# Patient Record
Sex: Male | Born: 1965 | Race: White | Marital: Married | Smoking: Never smoker
Health system: Southern US, Community
[De-identification: ages and names within clinical notes are randomized; demographics above are authoritative.]

---

## 2012-12-21 ENCOUNTER — Ambulatory Visit
Admission: RE | Admit: 2012-12-21 | Discharge: 2012-12-21 | Disposition: A | Discharge status: Home / Self Care | Payer: Managed Care, Other (non HMO) | Source: Ambulatory Visit | Attending: Emergency Medicine | Admitting: Emergency Medicine

## 2012-12-21 ENCOUNTER — Other Ambulatory Visit: Payer: Self-pay | Admitting: Emergency Medicine

## 2012-12-21 DIAGNOSIS — T1490XA Injury, unspecified, initial encounter: Secondary | ICD-10-CM

## 2013-05-24 ENCOUNTER — Other Ambulatory Visit: Payer: Self-pay | Admitting: Emergency Medicine

## 2013-05-24 DIAGNOSIS — M545 Low back pain, unspecified: Secondary | ICD-10-CM

## 2013-05-24 DIAGNOSIS — M79605 Pain in left leg: Principal | ICD-10-CM

## 2013-05-28 ENCOUNTER — Other Ambulatory Visit: Payer: Managed Care, Other (non HMO)

## 2015-01-01 IMAGING — CR DG WRIST COMPLETE 3+V*L*
4 series · 4 of 4 positions shown · non-contrast
Comparison: None.

CLINICAL DATA: Pain

EXAM:
LEFT WRIST - COMPLETE 3+ VIEW

[view not recorded (1 of 4)]
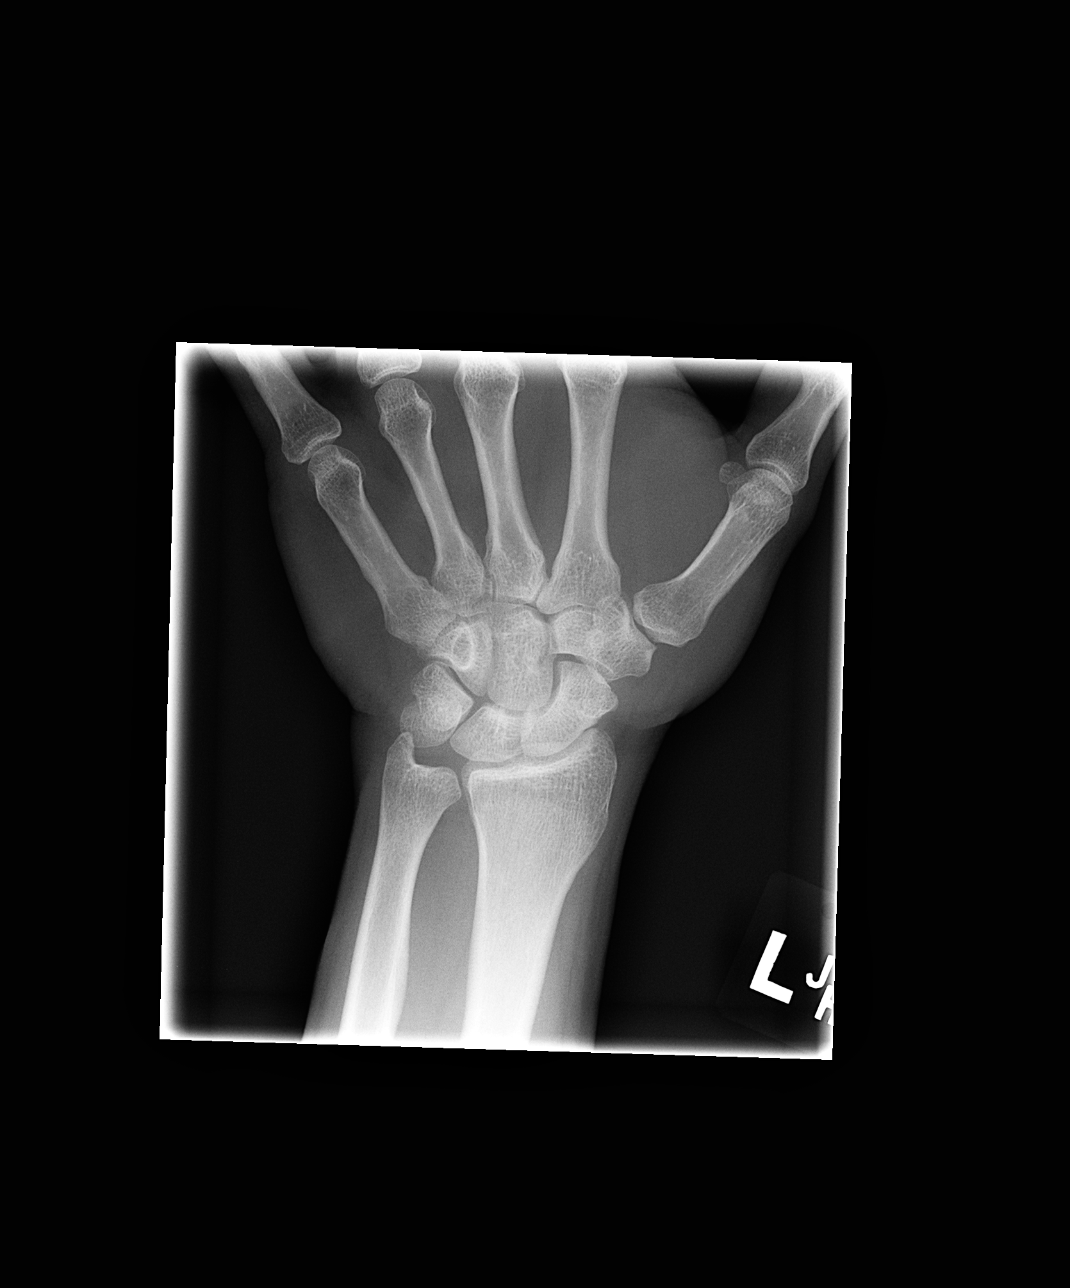

[view not recorded (2 of 4)]
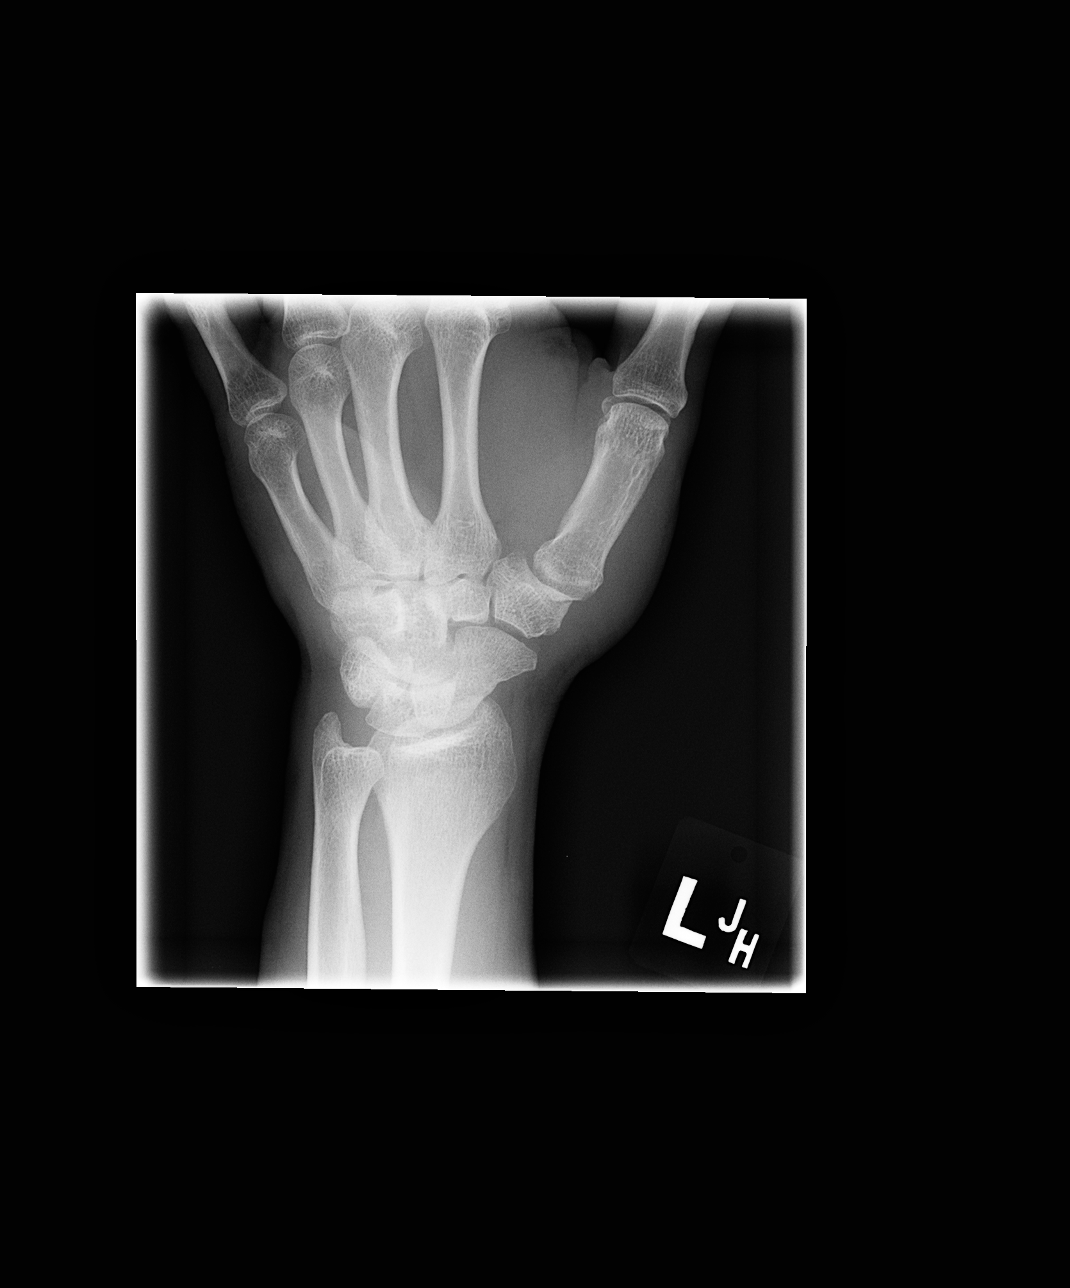

[view not recorded (3 of 4)]
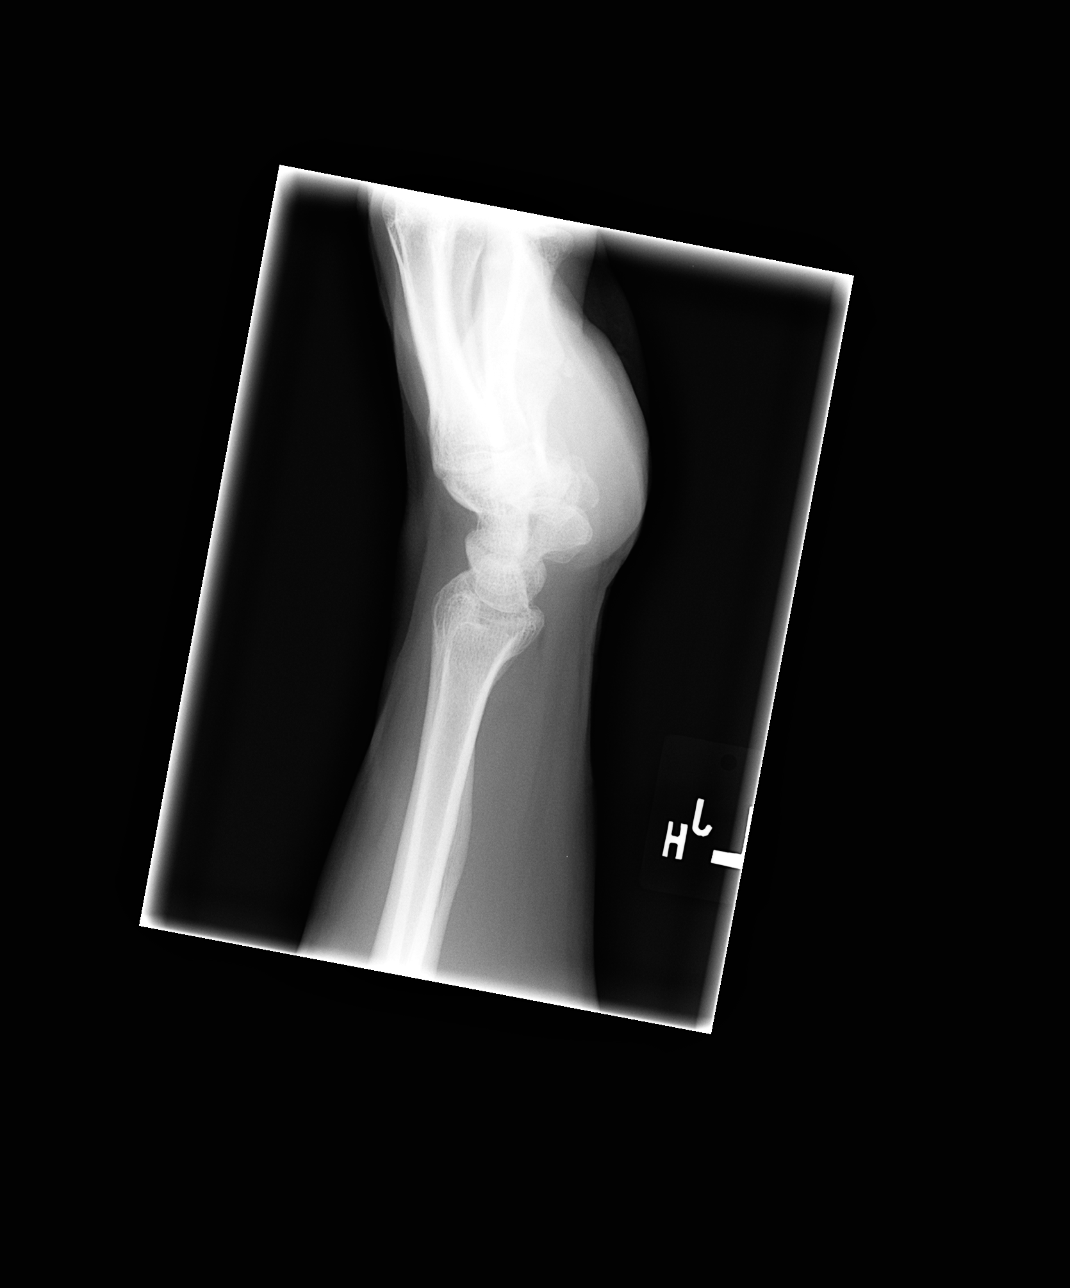

[view not recorded (4 of 4)]
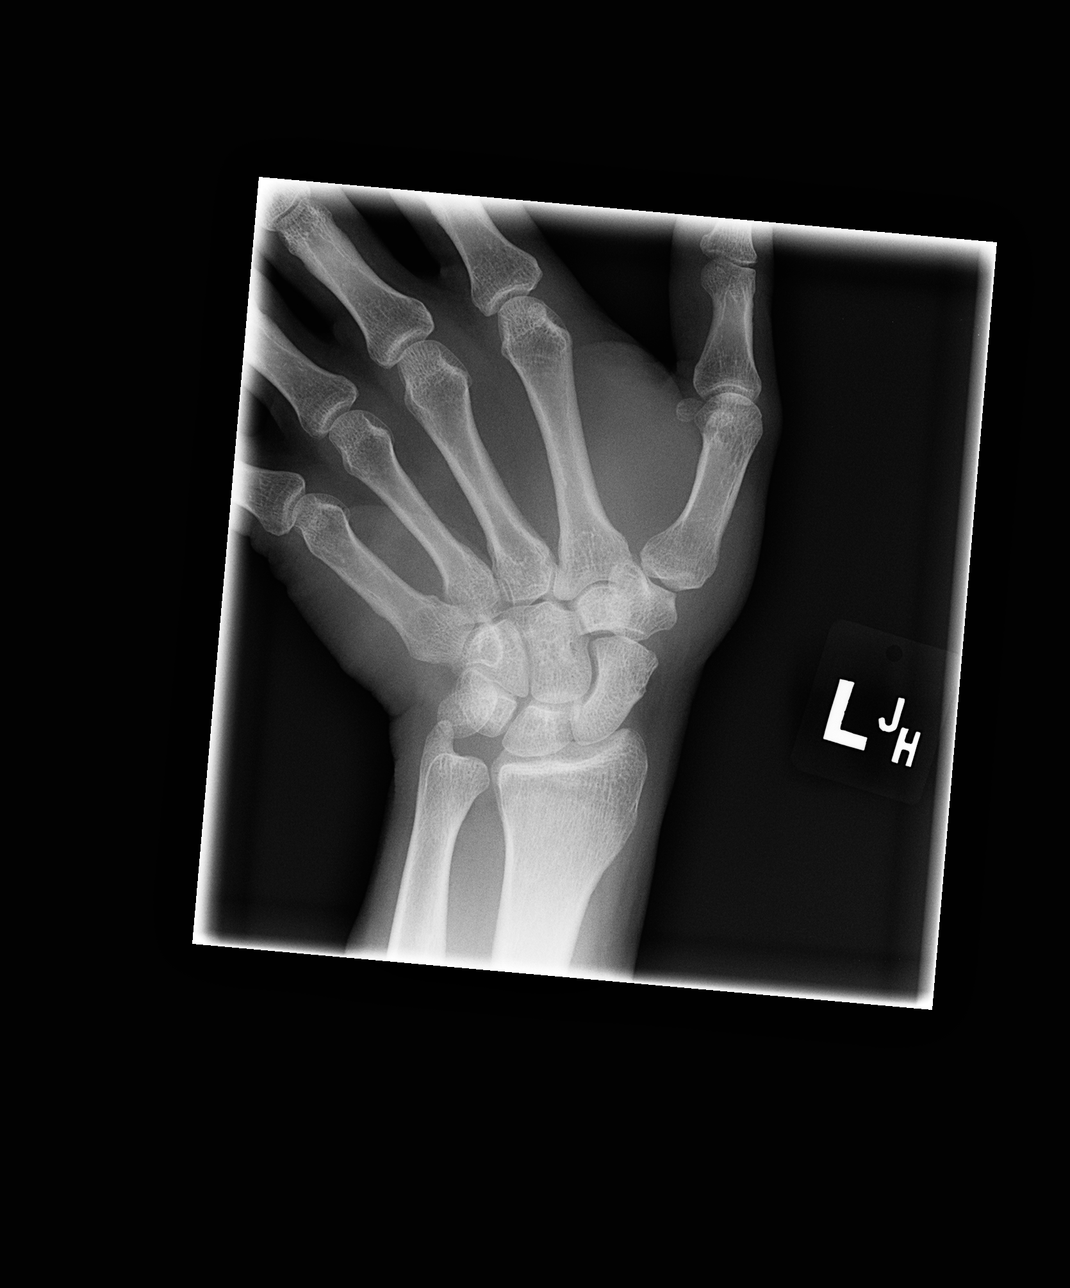

[4 of 4 positions shown; findings below may reference images not displayed]

FINDINGS: Frontal, lateral, oblique, and ulnar deviation scaphoid images were
obtained. There is no fracture or dislocation. Joint spaces appear
intact. No erosive change.
IMPRESSION: No abnormality noted.

## 2016-12-28 ENCOUNTER — Encounter: Payer: Self-pay | Admitting: Family Medicine

## 2016-12-28 ENCOUNTER — Ambulatory Visit: Payer: Commercial Managed Care - PPO | Admitting: Family Medicine

## 2016-12-28 DIAGNOSIS — M2141 Flat foot [pes planus] (acquired), right foot: Secondary | ICD-10-CM | POA: Insufficient documentation

## 2016-12-28 DIAGNOSIS — M2142 Flat foot [pes planus] (acquired), left foot: Secondary | ICD-10-CM

## 2016-12-28 DIAGNOSIS — M21072 Valgus deformity, not elsewhere classified, left ankle: Secondary | ICD-10-CM | POA: Diagnosis not present

## 2016-12-28 DIAGNOSIS — M21071 Valgus deformity, not elsewhere classified, right ankle: Secondary | ICD-10-CM

## 2016-12-28 DIAGNOSIS — M722 Plantar fascial fibromatosis: Secondary | ICD-10-CM

## 2016-12-28 MED ORDER — DICLOFENAC SODIUM 75 MG PO TBEC: DELAYED_RELEASE_TABLET | ORAL | 3 refills | Status: AC

## 2016-12-28 NOTE — Assessment & Plan Note (Signed)
See plan above.

## 2016-12-28 NOTE — Progress Notes (Signed)
HPI  CC: Left foot pain Patient is here to discuss his left-sided foot pain which developed approximately 2-3 months ago.  He states that he by a podiatrist who diagnosed him with plantar fasciitis.  Unfortunately interventions recommended at that time have not improved his symptoms.  The pain in his left foot is described as sharp, burning, and achy.  It is located along the plantar surface of the calcaneus.  It is worse first thing in the morning, but persists throughout the day.  Patient states that he has had plantar fasciitis in the right foot in the past which improved with just 3 weeks of intervention.  He denies any injury, trauma, or events that may have caused this pain.  No feelings of instability.  No swelling, erythema, or ecchymosis.  Patient spends most of his work day on a concrete surface and tries to run/jog for exercise when able to.  Traumatic: No  Location: Plantar surface of the calcaneus Quality: Sharp, burning, achy  Duration: 2-3 months Timing: First thing in the morning  Improving/Worsening: Unchanged Makes better: Stretching Makes worse: Prolonged immobility, hard surfaces Associated symptoms: None  Previous Interventions Tried: Stretching, ice, night splint, and injection (by podiatry)  Past Injuries: Plantar fasciitis on the contralateral foot Past Surgeries: None Smoking: None Family Hx: Noncontributory  ROS: Per HPI; in addition no fever, no rash, no additional weakness, no additional numbness, no additional paresthesias, and no additional falls/injury.   Objective: BP 110/82   Ht 5\' 9"  (1.753 m)   Wt 190 lb (86.2 kg)   BMI 28.06 kg/m  Gen: NAD, well groomed, a/o x3, normal affect.  CV: Well-perfused. Warm.  Resp: Non-labored.  Neuro: Sensation intact throughout. No gross coordination deficits.  Gait: Nonpathologic posture, unremarkable stride without signs of limp or balance issues. Ankle/Foot, Left: TTP noted at the plantar surface of the  calcaneus. No visible erythema, swelling, ecchymosis, or bony deformity. Notable pes planus deformity bilaterally. Transverse arch grossly collapsed bilaterally w/ toe splaying and bunion formation in development. Moderate tibiotalar valgus deviation present bilaterally; Range of motion is full in all directions. Strength is 5/5 in all directions. No tenderness at the insertion/body/myotendinous junction of the Achilles tendon; No tenderness on posterior aspects of lateral and medial malleolus; Stable lateral and medial ligaments; Talar dome nontender; Unremarkable calcaneal squeeze; No tenderness at the distal metatarsals; Able to walk 4 steps.    Assessment and Plan:  Plantar fasciitis Patient is here with signs and symptoms consistent with plantar fasciitis of the left foot.  We spent a significant amount of time discussing the pathophysiology of plantar fasciitis and the correlation with the structure of his feet. -Green shoe inserts provided today with medium scaphoid pads in place. -Diclofenac 75 mg twice daily times 7 days then twice daily as needed after that. -Encouraged continued use of night splint while immobile at home.  Manual stretching in the morning. -Frozen ice bottle for 20 minutes x3 every night. -Strongly encouraged patience and expectations, as this can sometimes take 6-12 months to resolve. -Follow-up 1-3 months  Next: Strong consideration for custom orthotics if patient has pain relief with Green shoe inserts.  Also, I discussed some of my concerns with repeat steroid injection around the plantar fascia if symptoms persist.  I also informed patient that I would not consider performing steroid injection for at least 2 months from today's visit.  Patient stated his understanding.  Bilateral pes planus See plan above.  Acquired bilateral valgus deformity of ankles  See plan above.   Meds ordered this encounter  Medications  . diclofenac (VOLTAREN) 75 MG EC tablet    Sig:  1 tablet, 2 times a day every day for 7 days.  Then 1 tablet, 2 times a day AS NEEDED after that.    Dispense:  60 tablet    Refill:  3     Kathee DeltonIan D Uri Covey, MD,MS [facility] 12/28/2016 4:44 PM

## 2016-12-28 NOTE — Assessment & Plan Note (Addendum)
Patient is here with signs and symptoms consistent with plantar fasciitis of the left foot.  We spent a significant amount of time discussing the pathophysiology of plantar fasciitis and the correlation with the structure of his feet. -Green shoe inserts provided today with medium scaphoid pads in place. -Diclofenac 75 mg twice daily times 7 days then twice daily as needed after that. -Encouraged continued use of night splint while immobile at home.  Manual stretching in the morning. -Frozen ice bottle for 20 minutes x3 every night. -Strongly encouraged patience and expectations, as this can sometimes take 6-12 months to resolve. -Follow-up 1-3 months  Next: Strong consideration for custom orthotics if patient has pain relief with Green shoe inserts.  Also, I discussed some of my concerns with repeat steroid injection around the plantar fascia if symptoms persist.  I also informed patient that I would not consider performing steroid injection for at least 2 months from today's visit.  Patient stated his understanding.
